# Patient Record
Sex: Male | Born: 1950 | Race: White | Hispanic: No | Marital: Married | Smoking: Former smoker
Health system: Southern US, Community
[De-identification: ages and names within clinical notes are randomized; demographics above are authoritative.]

## PROBLEM LIST (undated history)

## (undated) DIAGNOSIS — K635 Polyp of colon: Secondary | ICD-10-CM

## (undated) DIAGNOSIS — R519 Headache, unspecified: Secondary | ICD-10-CM

## (undated) DIAGNOSIS — M5430 Sciatica, unspecified side: Secondary | ICD-10-CM

## (undated) DIAGNOSIS — G4730 Sleep apnea, unspecified: Secondary | ICD-10-CM

## (undated) DIAGNOSIS — K760 Fatty (change of) liver, not elsewhere classified: Secondary | ICD-10-CM

## (undated) DIAGNOSIS — T7840XA Allergy, unspecified, initial encounter: Secondary | ICD-10-CM

## (undated) DIAGNOSIS — K5730 Diverticulosis of large intestine without perforation or abscess without bleeding: Secondary | ICD-10-CM

## (undated) DIAGNOSIS — J45909 Unspecified asthma, uncomplicated: Secondary | ICD-10-CM

## (undated) DIAGNOSIS — R51 Headache: Secondary | ICD-10-CM

## (undated) DIAGNOSIS — M1990 Unspecified osteoarthritis, unspecified site: Secondary | ICD-10-CM

## (undated) HISTORY — DX: Unspecified asthma, uncomplicated: J45.909

## (undated) HISTORY — PX: TONSILLECTOMY: SHX5217

## (undated) HISTORY — DX: Headache, unspecified: R51.9

## (undated) HISTORY — DX: Polyp of colon: K63.5

## (undated) HISTORY — DX: Headache: R51

## (undated) HISTORY — DX: Diverticulosis of large intestine without perforation or abscess without bleeding: K57.30

## (undated) HISTORY — DX: Unspecified osteoarthritis, unspecified site: M19.90

## (undated) HISTORY — DX: Allergy, unspecified, initial encounter: T78.40XA

## (undated) HISTORY — DX: Sciatica, unspecified side: M54.30

---

## 2008-06-11 ENCOUNTER — Ambulatory Visit: Payer: Self-pay | Admitting: General Surgery

## 2008-06-18 ENCOUNTER — Ambulatory Visit: Payer: Self-pay | Admitting: General Surgery

## 2008-06-19 ENCOUNTER — Emergency Department: Payer: Self-pay | Admitting: Emergency Medicine

## 2008-06-20 ENCOUNTER — Inpatient Hospital Stay: Payer: Self-pay | Admitting: Surgery

## 2010-01-21 ENCOUNTER — Ambulatory Visit: Payer: Self-pay | Admitting: Gastroenterology

## 2010-01-25 LAB — PATHOLOGY REPORT

## 2010-03-04 IMAGING — CR DG CHEST 2V
1 series · 2 of 2 positions shown · non-contrast
Comparison: none

REASON FOR EXAM: smoker
COMMENTS:

[Series 1: view not recorded · 0.17mm/px · 2 of 2 slices shown]
[im 1/2]
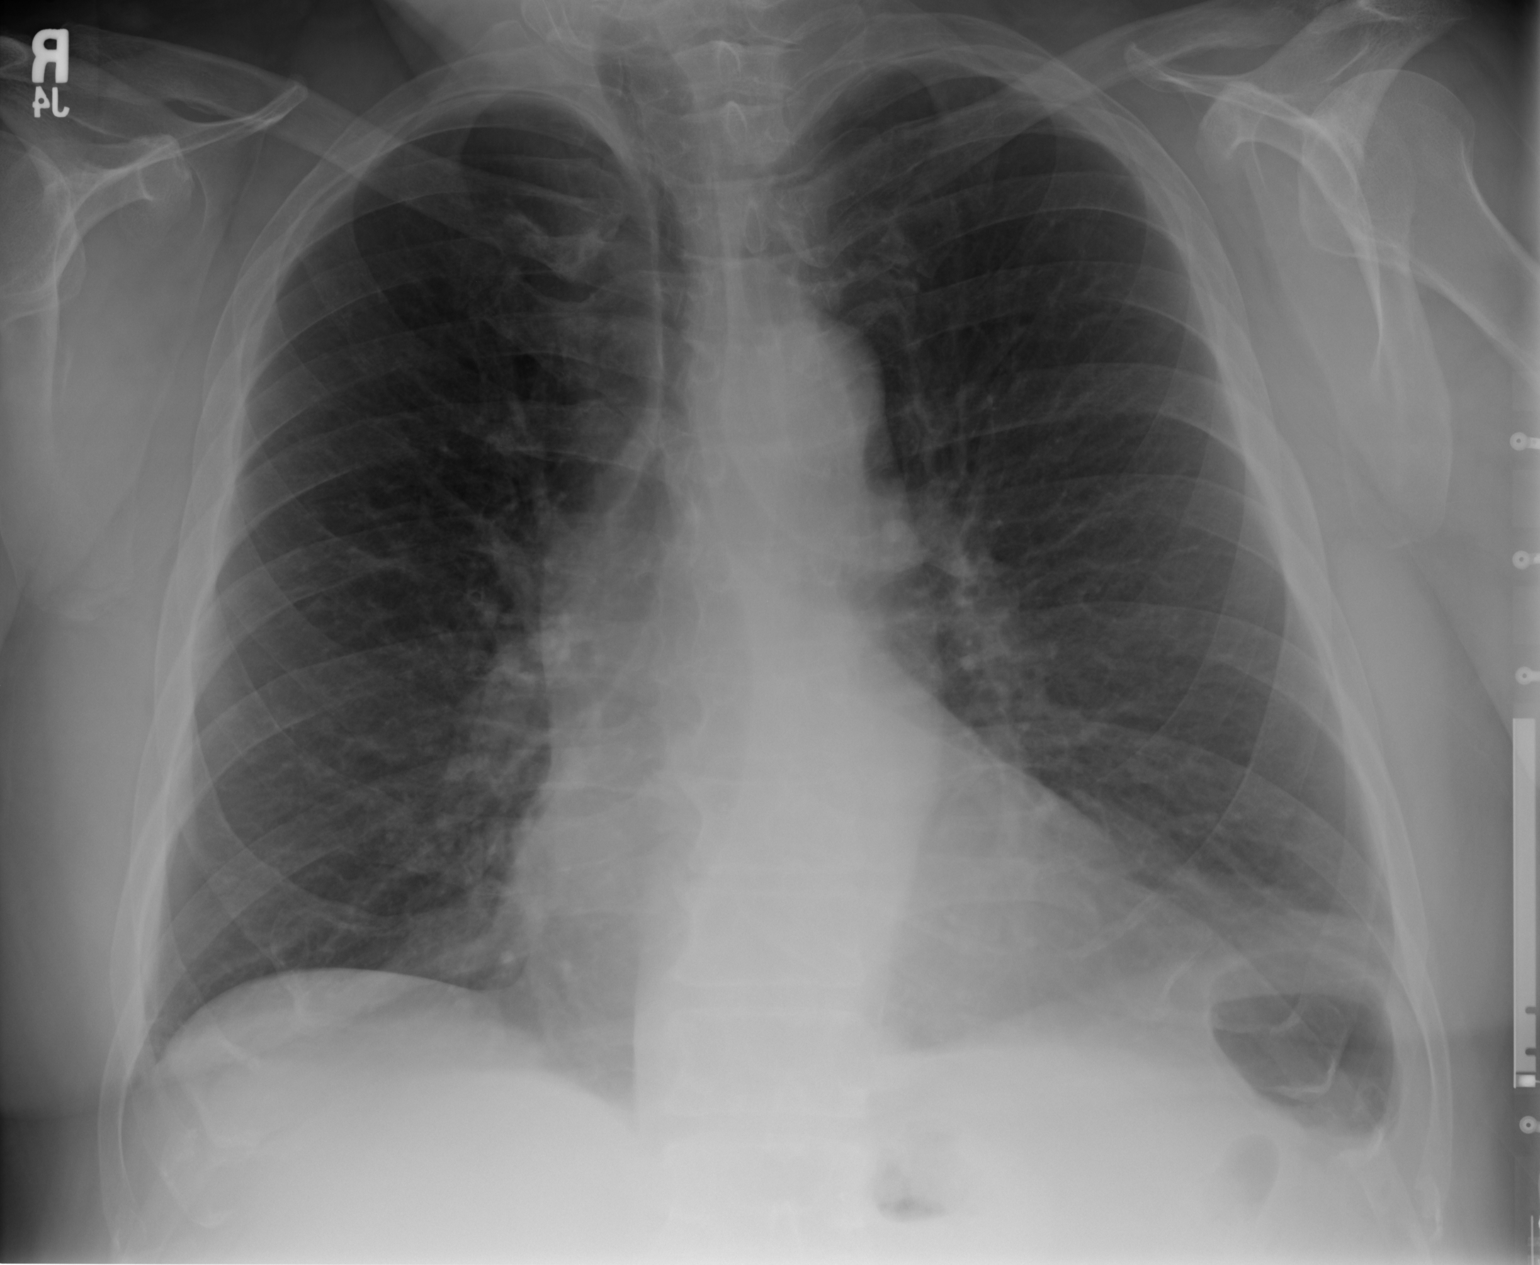
[im 2/2]
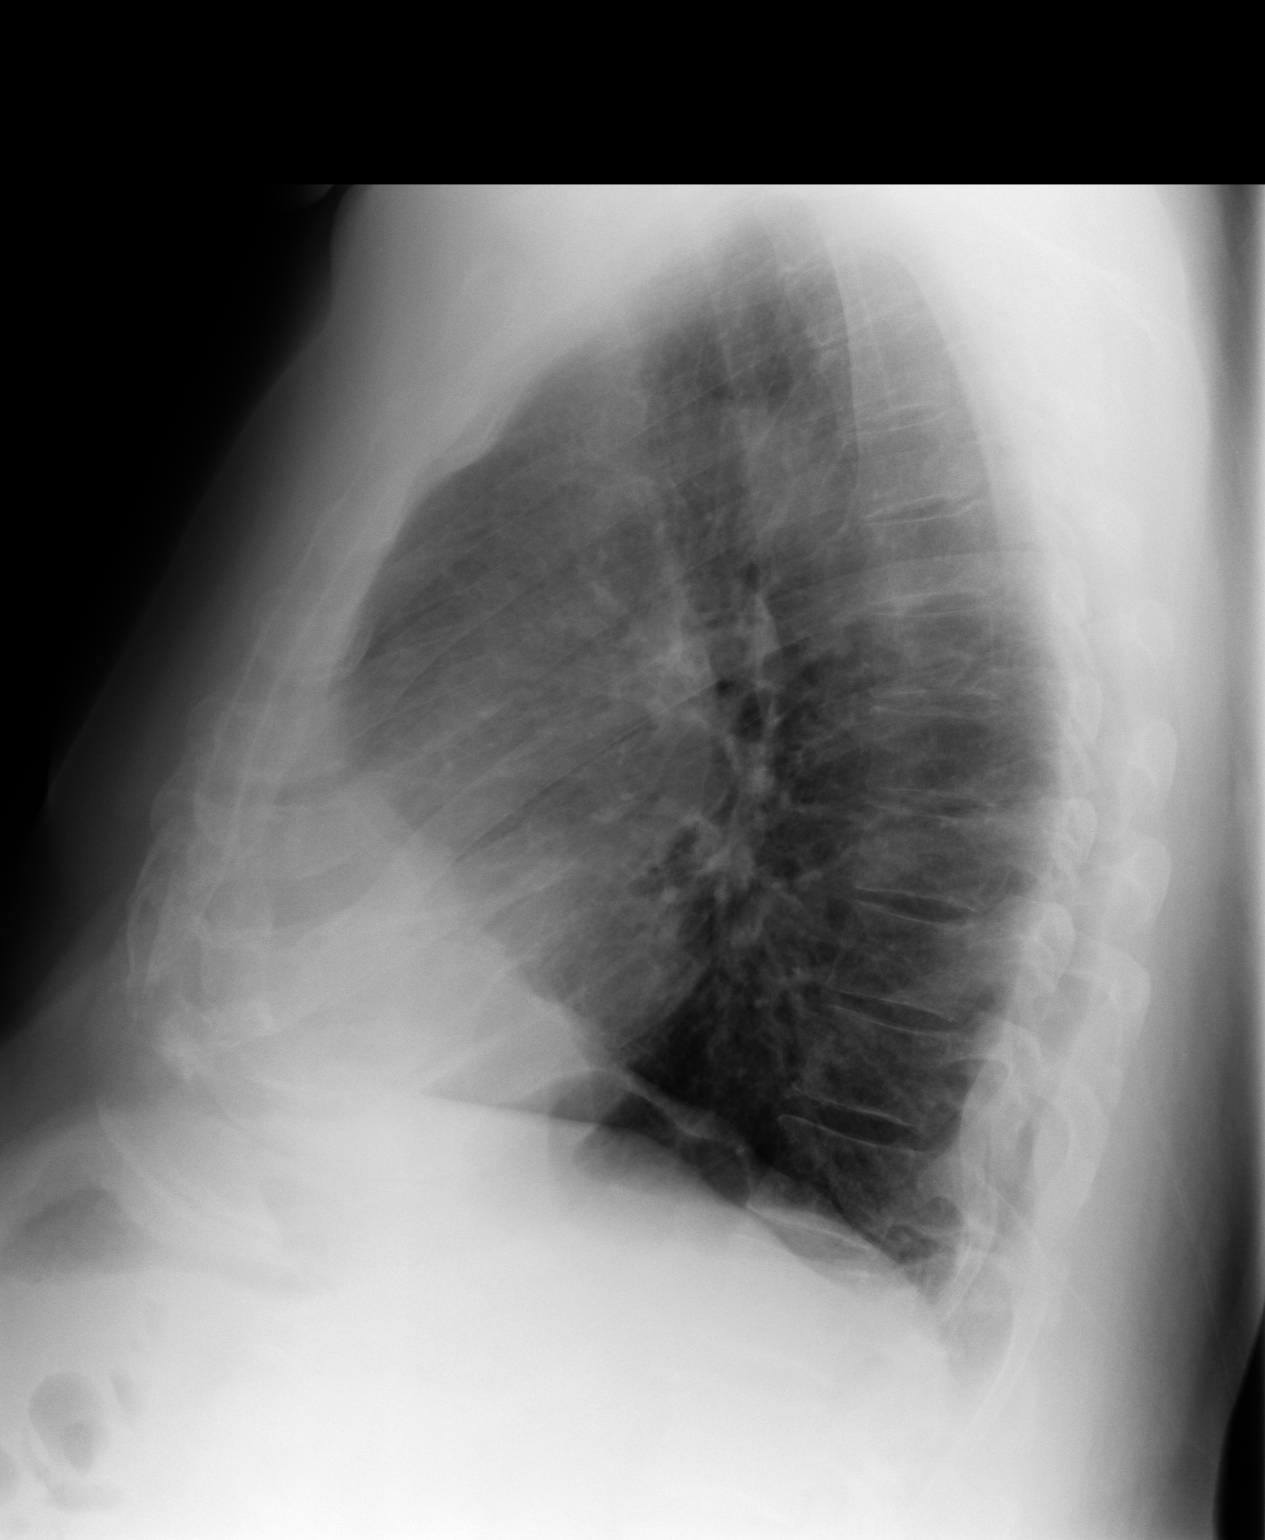

[2 of 2 positions shown; findings below may reference images not displayed]

PROCEDURE:     DXR - DXR CHEST PA (OR AP) AND LATERAL  - June 11, 2008 [DATE]

RESULT:     The lungs are mildly hyperinflated. There are coarse lung
markings at the left lung base. I see no discrete focal infiltrate however.
There is no pleural effusion. The cardiac silhouette is top normal in size.
The pulmonary vascularity is not engorged.
IMPRESSION: There are findings consistent with an element of COPD. I do
not see marked hyperinflation. There is no evidence of pneumonia nor of a
pulmonary parenchymal mass.

## 2014-08-24 ENCOUNTER — Telehealth: Payer: Self-pay | Admitting: Family Medicine

## 2014-08-24 DIAGNOSIS — E785 Hyperlipidemia, unspecified: Secondary | ICD-10-CM | POA: Insufficient documentation

## 2014-08-24 DIAGNOSIS — E875 Hyperkalemia: Secondary | ICD-10-CM

## 2014-08-24 NOTE — Telephone Encounter (Signed)
The patient's lab results were reviewed on [employer] please abstract patient chart and relay the following information to the patient:  This patient's most recent lab results have been reviewed and are within normal ranges including:  Blood counts Liver function Kidney function Thyroid function  Abnormal: Electrolytes normal except Potassium a little high at 5.3 (normal is 3.5-5.2), cut back on potassium rich foods Glucose high at 115mg /dL Cholesterol very high several values   Please have patient f/u with me to discuss treatment options and to recheck his potassium preferably no later than 1 month from now.   A copy of the results can be provided to the patient if they would like.

## 2014-08-24 NOTE — Telephone Encounter (Signed)
Per the request of Dr. Bobetta Lime, I contacted this patient to review the results from this patient's recent blood work and after he verified his date of birth, results were reviewed. Patient was informed that some of his labs were abnormal which requires him to come in for a follow up visit in 1 month. Meanwhile patient was encouraged to reduce his intake of potassium rich food, continue with a mindful diet and to incorporate regular exercise. Patient agreed and stated he will call tomorrow to schedule his appointment.

## 2014-09-16 ENCOUNTER — Other Ambulatory Visit: Payer: Self-pay | Admitting: Family Medicine

## 2014-09-16 ENCOUNTER — Encounter: Payer: Self-pay | Admitting: Family Medicine

## 2014-09-16 ENCOUNTER — Ambulatory Visit (INDEPENDENT_AMBULATORY_CARE_PROVIDER_SITE_OTHER): Payer: 59 | Admitting: Family Medicine

## 2014-09-16 VITALS — BP 118/72 | HR 121 | Temp 98.2°F | Resp 18 | Ht 71.0 in | Wt 276.6 lb

## 2014-09-16 DIAGNOSIS — R0982 Postnasal drip: Secondary | ICD-10-CM

## 2014-09-16 DIAGNOSIS — J309 Allergic rhinitis, unspecified: Secondary | ICD-10-CM | POA: Insufficient documentation

## 2014-09-16 DIAGNOSIS — L989 Disorder of the skin and subcutaneous tissue, unspecified: Secondary | ICD-10-CM | POA: Insufficient documentation

## 2014-09-16 DIAGNOSIS — E785 Hyperlipidemia, unspecified: Secondary | ICD-10-CM | POA: Diagnosis not present

## 2014-09-16 DIAGNOSIS — M1990 Unspecified osteoarthritis, unspecified site: Secondary | ICD-10-CM | POA: Insufficient documentation

## 2014-09-16 DIAGNOSIS — IMO0001 Reserved for inherently not codable concepts without codable children: Secondary | ICD-10-CM

## 2014-09-16 DIAGNOSIS — M5431 Sciatica, right side: Secondary | ICD-10-CM | POA: Diagnosis not present

## 2014-09-16 DIAGNOSIS — Z0000 Encounter for general adult medical examination without abnormal findings: Secondary | ICD-10-CM | POA: Diagnosis not present

## 2014-09-16 DIAGNOSIS — K5730 Diverticulosis of large intestine without perforation or abscess without bleeding: Secondary | ICD-10-CM | POA: Insufficient documentation

## 2014-09-16 DIAGNOSIS — Z8709 Personal history of other diseases of the respiratory system: Secondary | ICD-10-CM | POA: Insufficient documentation

## 2014-09-16 DIAGNOSIS — K635 Polyp of colon: Secondary | ICD-10-CM | POA: Insufficient documentation

## 2014-09-16 DIAGNOSIS — K5909 Other constipation: Secondary | ICD-10-CM | POA: Insufficient documentation

## 2014-09-16 NOTE — Patient Instructions (Signed)
Fat and Cholesterol Control Diet Fat and cholesterol levels in your blood and organs are influenced by your diet. High levels of fat and cholesterol may lead to diseases of the heart, small and large blood vessels, gallbladder, liver, and pancreas. CONTROLLING FAT AND CHOLESTEROL WITH DIET Although exercise and lifestyle factors are important, your diet is key. That is because certain foods are known to raise cholesterol and others to lower it. The goal is to balance foods for their effect on cholesterol and more importantly, to replace saturated and trans fat with other types of fat, such as monounsaturated fat, polyunsaturated fat, and omega-3 fatty acids. On average, a person should consume no more than 15 to 17 g of saturated fat daily. Saturated and trans fats are considered "bad" fats, and they will raise LDL cholesterol. Saturated fats are primarily found in animal products such as meats, butter, and cream. However, that does not mean you need to give up all your favorite foods. Today, there are good tasting, low-fat, low-cholesterol substitutes for most of the things you like to eat. Choose low-fat or nonfat alternatives. Choose round or loin cuts of red meat. These types of cuts are lowest in fat and cholesterol. Chicken (without the skin), fish, veal, and ground turkey breast are great choices. Eliminate fatty meats, such as hot dogs and salami. Even shellfish have little or no saturated fat. Have a 3 oz (85 g) portion when you eat lean meat, poultry, or fish. Trans fats are also called "partially hydrogenated oils." They are oils that have been scientifically manipulated so that they are solid at room temperature resulting in a longer shelf life and improved taste and texture of foods in which they are added. Trans fats are found in stick margarine, some tub margarines, cookies, crackers, and baked goods.  When baking and cooking, oils are a great substitute for butter. The monounsaturated oils are  especially beneficial since it is believed they lower LDL and raise HDL. The oils you should avoid entirely are saturated tropical oils, such as coconut and palm.  Remember to eat a lot from food groups that are naturally free of saturated and trans fat, including fish, fruit, vegetables, beans, grains (barley, rice, couscous, bulgur wheat), and pasta (without cream sauces).  IDENTIFYING FOODS THAT LOWER FAT AND CHOLESTEROL  Soluble fiber may lower your cholesterol. This type of fiber is found in fruits such as apples, vegetables such as broccoli, potatoes, and carrots, legumes such as beans, peas, and lentils, and grains such as barley. Foods fortified with plant sterols (phytosterol) may also lower cholesterol. You should eat at least 2 g per day of these foods for a cholesterol lowering effect.  Read package labels to identify low-saturated fats, trans fat free, and low-fat foods at the supermarket. Select cheeses that have only 2 to 3 g saturated fat per ounce. Use a heart-healthy tub margarine that is free of trans fats or partially hydrogenated oil. When buying baked goods (cookies, crackers), avoid partially hydrogenated oils. Breads and muffins should be made from whole grains (whole-wheat or whole oat flour, instead of "flour" or "enriched flour"). Buy non-creamy canned soups with reduced salt and no added fats.  FOOD PREPARATION TECHNIQUES  Never deep-fry. If you must fry, either stir-fry, which uses very little fat, or use non-stick cooking sprays. When possible, broil, bake, or roast meats, and steam vegetables. Instead of putting butter or margarine on vegetables, use lemon and herbs, applesauce, and cinnamon (for squash and sweet potatoes). Use nonfat   yogurt, salsa, and low-fat dressings for salads.  LOW-SATURATED FAT / LOW-FAT FOOD SUBSTITUTES Meats / Saturated Fat (g)  Avoid: Steak, marbled (3 oz/85 g) / 11 g  Choose: Steak, lean (3 oz/85 g) / 4 g  Avoid: Hamburger (3 oz/85 g) / 7  g  Choose: Hamburger, lean (3 oz/85 g) / 5 g  Avoid: Ham (3 oz/85 g) / 6 g  Choose: Ham, lean cut (3 oz/85 g) / 2.4 g  Avoid: Chicken, with skin, dark meat (3 oz/85 g) / 4 g  Choose: Chicken, skin removed, dark meat (3 oz/85 g) / 2 g  Avoid: Chicken, with skin, light meat (3 oz/85 g) / 2.5 g  Choose: Chicken, skin removed, light meat (3 oz/85 g) / 1 g Dairy / Saturated Fat (g)  Avoid: Whole milk (1 cup) / 5 g  Choose: Low-fat milk, 2% (1 cup) / 3 g  Choose: Low-fat milk, 1% (1 cup) / 1.5 g  Choose: Skim milk (1 cup) / 0.3 g  Avoid: Hard cheese (1 oz/28 g) / 6 g  Choose: Skim milk cheese (1 oz/28 g) / 2 to 3 g  Avoid: Cottage cheese, 4% fat (1 cup) / 6.5 g  Choose: Low-fat cottage cheese, 1% fat (1 cup) / 1.5 g  Avoid: Ice cream (1 cup) / 9 g  Choose: Sherbet (1 cup) / 2.5 g  Choose: Nonfat frozen yogurt (1 cup) / 0.3 g  Choose: Frozen fruit bar / trace  Avoid: Whipped cream (1 tbs) / 3.5 g  Choose: Nondairy whipped topping (1 tbs) / 1 g Condiments / Saturated Fat (g)  Avoid: Mayonnaise (1 tbs) / 2 g  Choose: Low-fat mayonnaise (1 tbs) / 1 g  Avoid: Butter (1 tbs) / 7 g  Choose: Extra light margarine (1 tbs) / 1 g  Avoid: Coconut oil (1 tbs) / 11.8 g  Choose: Olive oil (1 tbs) / 1.8 g  Choose: Corn oil (1 tbs) / 1.7 g  Choose: Safflower oil (1 tbs) / 1.2 g  Choose: Sunflower oil (1 tbs) / 1.4 g  Choose: Soybean oil (1 tbs) / 2.4 g  Choose: Canola oil (1 tbs) / 1 g Document Released: 03/06/2005 Document Revised: 07/01/2012 Document Reviewed: 06/04/2013 [employer] Patient Information 2015 [employer]. This information is not intended to replace advice given to you by your health care provider. Make sure you discuss any questions you have with your health care provider.  

## 2014-09-16 NOTE — Progress Notes (Deleted)
Name: Michael Bartlett   MRN: [id number]    DOB: Aug 28, 1950   Date:09/16/2014       Progress Note  Subjective  Chief Complaint  Chief Complaint  Patient presents with  . Annual Exam  . Referral    patient needs a referral to a dermatologist for possible skin cancer    HPI  Patient is here today for a Male Medicare Wellness Visit:  The patient has been in otherwise good general health and voices no acute concerns today.   No problem-specific assessment & plan notes found for this encounter.   No past medical history on file.  No past surgical history on file.  No family history on file.  History   Social History  . Marital Status: Married    Spouse Name: N/A  . Number of Children: N/A  . Years of Education: N/A   Occupational History  . Not on file.   Social History Main Topics  . Smoking status: Not on file  . Smokeless tobacco: Not on file  . Alcohol Use: Not on file  . Drug Use: Not on file  . Sexual Activity: Not on file   Other Topics Concern  . Not on file   Social History Narrative  . No narrative on file     Current outpatient prescriptions:  .  Cholecalciferol (D3-1000) 1000 UNITS capsule, Take 1 capsule by mouth daily., Disp: , Rfl:  .  Coenzyme Q10 (COQ10) 100 MG CAPS, Take 1 capsule by mouth daily., Disp: , Rfl:  .  fluticasone (FLONASE) 50 MCG/ACT nasal spray, Place 2 sprays into both nostrils daily., Disp: , Rfl:  .  Multiple Vitamin (MULTI-VITAMINS) TABS, Take 1 tablet by mouth daily., Disp: , Rfl:  .  Omega-3 Fatty Acids (FISH OIL) 1200 MG CPDR, Take 1 capsule by mouth daily., Disp: , Rfl:  .  omeprazole (PRILOSEC) 40 MG capsule, Take 1 capsule by mouth daily., Disp: , Rfl: 5 .  saw palmetto (RA SAW PALMETTO) 80 MG capsule, Take 1 capsule by mouth daily., Disp: , Rfl:  .  SENNA S 8.6-50 MG per tablet, Take 1 tablet by mouth 2 (two) times daily as needed., Disp: , Rfl: 0 .  vitamin E 400 UNIT capsule, Take 1 capsule by mouth daily., Disp: ,  Rfl:   Allergies  Allergen Reactions  . Aspirin Anaphylaxis and Hives    Fall Risk: No flowsheet data found.  No flowsheet data found.   ROS  CONSTITUTIONAL: No significant weight changes, fever, chills, weakness or fatigue.  HEENT:  - Eyes: No visual changes.  - Ears: No auditory changes. No pain.  - Nose: No sneezing, congestion, runny nose. - Throat: No sore throat. No changes in swallowing. SKIN: No rash or itching.  CARDIOVASCULAR: No chest pain, chest pressure or chest discomfort. No palpitations or edema.  RESPIRATORY: No shortness of breath, cough or sputum.  GASTROINTESTINAL: No anorexia, nausea, vomiting. No changes in bowel habits. No abdominal pain or blood.  GENITOURINARY: No dysuria. No frequency. No discharge.  NEUROLOGICAL: No headache, dizziness, syncope, paralysis, ataxia, numbness or tingling in the extremities. No memory changes. No change in bowel or bladder control.  MUSCULOSKELETAL: No joint pain. No muscle pain. HEMATOLOGIC: No anemia, bleeding or bruising.  LYMPHATICS: No enlarged lymph nodes.  PSYCHIATRIC: No change in mood. No change in sleep pattern.  ENDOCRINOLOGIC: No reports of sweating, cold or heat intolerance. No polyuria or polydipsia.   Objective  Filed Vitals:   09/16/14 1417  BP: 118/72  Pulse: 121  Temp: 98.2 F (36.8 C)  TempSrc: Oral  Resp: 18  Height: 5\' 11"  (1.803 m)  Weight: 276 lb 9.6 oz (125.465 kg)  SpO2: 95%   Body mass index is 38.6 kg/(m^2).  Physical Exam  Constitutional: Patient appears well-developed and well-nourished. In no distress.  HEENT:  - Head: Normocephalic and atraumatic.  - Ears: Bilateral TMs gray, no erythema or effusion - Nose: Nasal mucosa moist - Mouth/Throat: Oropharynx is clear and moist. No tonsillar hypertrophy or erythema. No post nasal drainage.  - Eyes: Conjunctivae clear, EOM movements normal. PERRLA. No scleral icterus.  Neck: Normal range of motion. Neck supple. No JVD present. No  thyromegaly present.  Cardiovascular: Normal rate, regular rhythm and normal heart sounds.  No murmur heard.  Pulmonary/Chest: Effort normal and breath sounds normal. No respiratory distress. Musculoskeletal: Normal range of motion bilateral UE and LE, no joint effusions. Peripheral vascular: Bilateral LE no edema. Neurological: CN II-XII grossly intact with no focal deficits. Alert and oriented to person, place, and time. Coordination, balance, strength, speech and gait are normal.  Skin: Skin is warm and dry. No rash noted. No erythema.  Psychiatric: Patient has a normal mood and affect. Behavior is normal in office today. Judgment and thought content normal in office today.   Assessment & Plan  Functional ability/safety issues: No Issues Hearing issues: Addressed  Activities of daily living: Discussed Home safety issues: No Issues  End Of Life Planning: Offered verbal information regarding advanced directives, healthcare power of attorney.  Preventative care, Health maintenance, Preventative health measures discussed.  Preventative screenings discussed today: lab work, colonoscopy, PSA.  Men age 57 to 74 years if ever smoked recommended to get a one time AAA ultrasound screening exam.  Low Dose CT Chest recommended if Age 37-80 years, 30 pack-year currently smoking OR have quit w/in 15years.   Lifestyle risk factor issued reviewed: Diet, exercise, weight management, advised patient smoking is not healthy, nutrition/diet.  Preventative health measures discussed (5-10 year plan).  Reviewed and recommended vaccinations: - Pneumovax  - Prevnar  - Annual Influenza - Zostavax - Tdap   Depression screening: Done Fall risk screening: Done Discuss ADLs/IADLs: Done  Current medical providers: See HPI  Other health risk factors identified this visit: No other issues Cognitive impairment issues: None identified  All above discussed with patient. Appropriate education, counseling  and referral will be made based upon the above.

## 2014-09-16 NOTE — Progress Notes (Signed)
Name: Michael Bartlett   MRN: [id number]    DOB: 05/14/1950   Date:09/16/2014       Progress Note  Subjective  Chief Complaint  Chief Complaint  Patient presents with  . Annual Exam  . Referral    patient needs a referral to a dermatologist for possible skin cancer  . Medication Refill    omeprazole    HPI  Patient is here today for a Complete Male Physical Exam:  The patient has some ongoing concerns regarding skin cancer potential. Overall feels his health is stable. Diet is well balanced but consists of large portions. In general does not exercise regularly. Sees dentist regularly and addresses vision concerns with ophthalmologist if applicable. In regards to sexual activity the patient is currently sexually active. Currently is not concerned about exposure to any STDs. Constipation improved on Senakot medication prescribed previously. He has also implemented lifestyle changes.   Hyperlipidemia: Patient presents with hyperlipidemia.  He was tested because of his obesity.  His last labs showed Total cholesterol of 260 mg/dL, HDL 50 mg/dL, LDL 174 mg/dL,  Triglycerides 181 mg/dL.  There is a family history of hyperlipidemia. There is not a family history of early ischemia heart disease.    Past Medical History  Diagnosis Date  . Allergy   . Sciatica   . Arthritis   . Sinus headache   . Asthma   . Colon polyps   . Diverticulosis of large intestine without hemorrhage     Past Surgical History  Procedure Laterality Date  . Tonsillectomy      Family History  Problem Relation Age of Onset  . Bleeding Disorder Mother   . Stroke Father   . Heart disease Father   . Alcohol abuse Father   . Depression Maternal Grandmother   . Mental illness Maternal Grandmother     History   Social History  . Marital Status: Married    Spouse Name: N/A  . Number of Children: N/A  . Years of Education: N/A   Occupational History  . Not on file.   Social History Main Topics  .  Smoking status: Former Research scientist (life sciences)  . Smokeless tobacco: Not on file     Comment: will be 50yrs w/o smoking October 2016  . Alcohol Use: No  . Drug Use: No  . Sexual Activity:    Partners: Female    Museum/gallery curator: None   Other Topics Concern  . Not on file   Social History Narrative  . No narrative on file     Current outpatient prescriptions:  .  Cholecalciferol (D3-1000) 1000 UNITS capsule, Take 1 capsule by mouth daily., Disp: , Rfl:  .  Coenzyme Q10 (COQ10) 100 MG CAPS, Take 1 capsule by mouth daily., Disp: , Rfl:  .  fluticasone (FLONASE) 50 MCG/ACT nasal spray, Place 2 sprays into both nostrils daily., Disp: , Rfl:  .  Multiple Vitamin (MULTI-VITAMINS) TABS, Take 1 tablet by mouth daily., Disp: , Rfl:  .  Omega-3 Fatty Acids (FISH OIL) 1200 MG CPDR, Take 1 capsule by mouth daily., Disp: , Rfl:  .  omeprazole (PRILOSEC) 40 MG capsule, Take 1 capsule by mouth daily., Disp: , Rfl: 5 .  saw palmetto (RA SAW PALMETTO) 80 MG capsule, Take 1 capsule by mouth daily., Disp: , Rfl:  .  SENNA S 8.6-50 MG per tablet, Take 1 tablet by mouth 2 (two) times daily as needed., Disp: , Rfl: 0 .  vitamin E 400 UNIT capsule, Take  1 capsule by mouth daily., Disp: , Rfl:   Allergies  Allergen Reactions  . Aspirin Anaphylaxis and Hives   Depression screen [facility] 2/9 09/16/2014  Decreased Interest 0  Down, Depressed, Hopeless 0  PHQ - 2 Score 0    ROS  CONSTITUTIONAL: No significant weight changes, fever, chills, weakness or fatigue.  HEENT:  - Eyes: No visual changes.  - Ears: No auditory changes. No pain.  - Nose: No sneezing, congestion, runny nose. - Throat: No sore throat. No changes in swallowing. SKIN: Suspicious changes especially sun exposed areas  CARDIOVASCULAR: No chest pain, chest pressure or chest discomfort. No palpitations or edema.  RESPIRATORY: No shortness of breath, cough or sputum.  GASTROINTESTINAL: No anorexia, nausea, vomiting. No changes in bowel habits,  constipation improved. No abdominal pain or blood.  GENITOURINARY: No dysuria. No frequency. No discharge.  NEUROLOGICAL: No headache, dizziness, syncope, paralysis, ataxia, numbness or tingling in the extremities. No memory changes. No change in bowel or bladder control.  MUSCULOSKELETAL: No joint pain. No muscle pain. HEMATOLOGIC: No anemia, bleeding or bruising.  LYMPHATICS: No enlarged lymph nodes.  PSYCHIATRIC: No change in mood. No change in sleep pattern.  ENDOCRINOLOGIC: No reports of sweating, cold or heat intolerance. No polyuria or polydipsia.   Objective  Filed Vitals:   09/16/14 1417  BP: 118/72  Pulse: 121  Temp: 98.2 F (36.8 C)  TempSrc: Oral  Resp: 18  Height: 5\' 11"  (1.803 m)  Weight: 276 lb 9.6 oz (125.465 kg)  SpO2: 95%   Body mass index is 38.6 kg/(m^2).   Depression screen [facility] 2/9 09/16/2014  Decreased Interest 0  Down, Depressed, Hopeless 0  PHQ - 2 Score 0    Lab work done 08/20/14 outside sources: Cholesterol total 260mg /dL LDL 174mg /dL HDL 50mg /dL Triglycerides 181mg /dL VLDL 36mg /dL  Physical Exam  Constitutional: Patient is obese and well-nourished. In no distress.  HEENT:  - Head: Normocephalic and atraumatic.  - Ears: Bilateral TMs gray, no erythema or effusion - Nose: Nasal mucosa moist - Mouth/Throat: Oropharynx is clear and moist. No tonsillar hypertrophy or erythema. No post nasal drainage.  - Eyes: Conjunctivae clear, EOM movements normal. PERRLA. No scleral icterus.  Neck: Normal range of motion. Neck supple. No JVD present. No thyromegaly present.  Cardiovascular: Normal rate, regular rhythm and normal heart sounds.  No murmur heard.  Pulmonary/Chest: Effort normal and breath sounds normal. No respiratory distress. Abdominal: Obese and Soft. Bowel sounds are normal, no distension. There is no tenderness. no masses. Does have a midline ventral hernia. BREAST: Bilateral breast exam normal with no masses, skin changes or nipple  discharge MALE GENITALIA: Bilateral testes descended with no masses, no penile lesions, no penile discharge. PROSTATE: deferred RECTAL: deferred Musculoskeletal: Normal range of motion bilateral UE and LE, no joint effusions. Right gluteal midpoint reproducible pain on straight leg test. Peripheral vascular: Bilateral LE no edema. Neurological: CN II-XII grossly intact with no focal deficits. Alert and oriented to person, place, and time. Coordination, balance, strength, speech and gait are normal.  Skin: Skin is warm and dry. Generalized actinic keratosis scalp, back, shoulders, face. Scaly raised lesion above left eyebrow border. Scattered skin tags.  Psychiatric: Patient has a normal mood and affect. Behavior is normal in office today. Judgment and thought content normal in office today.    Assessment & Plan  1. Annual physical exam   2. Obesity, Class II, BMI 35-39.9, with comorbidity The patient has been counseled on their higher than normal BMI.  They have verbally expressed understanding their increased risk for other diseases.  In efforts to meet a better target BMI goal the patient has been counseled on lifestyle, diet and exercise modification tactics. Start with moderate intensity aerobic exercise (walking, jogging, elliptical, swimming, group or individual sports, hiking) at least 71mins a day at least 4 days a week and increase intensity, duration, frequency as tolerated. Diet should include well balance fresh fruits and vegetables avoiding processed foods, carbohydrates and sugars. Drink at least 8oz 10 glasses a day avoiding sodas, sugary fruit drinks, sweetened tea. Check weight on a reliable scale daily and monitor weight loss progress daily. Consider investing in mobile phone apps that will help keep track of weight loss goals.   3. Hyperlipidemia LDL goal <100 Reviewed blood work in detail and recommended statin therapy. He has had intolerance to pravastatin in the past due  to myalgia/arthralgia. He will work on lifestyle changes and we will repeat FLP in 6 months.  4. Skin lesion of face Will refer to dermatology for further work up.  - Ambulatory referral to Dermatology

## 2014-09-30 ENCOUNTER — Encounter: Payer: Self-pay | Admitting: Family Medicine

## 2014-11-11 ENCOUNTER — Other Ambulatory Visit (INDEPENDENT_AMBULATORY_CARE_PROVIDER_SITE_OTHER): Payer: 59 | Admitting: Family Medicine

## 2014-11-11 DIAGNOSIS — Z23 Encounter for immunization: Secondary | ICD-10-CM

## 2014-11-25 ENCOUNTER — Other Ambulatory Visit: Payer: Self-pay | Admitting: Unknown Physician Specialty

## 2014-12-30 ENCOUNTER — Other Ambulatory Visit: Payer: Self-pay | Admitting: Unknown Physician Specialty

## 2015-01-18 ENCOUNTER — Encounter: Payer: Self-pay | Admitting: Family Medicine

## 2015-01-18 ENCOUNTER — Ambulatory Visit (INDEPENDENT_AMBULATORY_CARE_PROVIDER_SITE_OTHER): Payer: Commercial Managed Care - HMO | Admitting: Family Medicine

## 2015-01-18 VITALS — BP 84/50 | HR 91 | Temp 97.8°F | Resp 18 | Ht 71.0 in | Wt 246.9 lb

## 2015-01-18 DIAGNOSIS — J309 Allergic rhinitis, unspecified: Secondary | ICD-10-CM

## 2015-01-18 DIAGNOSIS — K219 Gastro-esophageal reflux disease without esophagitis: Secondary | ICD-10-CM | POA: Insufficient documentation

## 2015-01-18 DIAGNOSIS — J45909 Unspecified asthma, uncomplicated: Secondary | ICD-10-CM

## 2015-01-18 DIAGNOSIS — R0982 Postnasal drip: Secondary | ICD-10-CM

## 2015-01-18 DIAGNOSIS — I959 Hypotension, unspecified: Secondary | ICD-10-CM | POA: Insufficient documentation

## 2015-01-18 MED ORDER — FLUTICASONE PROPIONATE 50 MCG/ACT NA SUSP: 2.0000 | Freq: Every day | NASAL | Status: AC

## 2015-01-18 MED ORDER — OMEPRAZOLE 40 MG PO CPDR: 40.0000 mg | DELAYED_RELEASE_CAPSULE | Freq: Every day | ORAL | Status: AC

## 2015-01-18 NOTE — Progress Notes (Signed)
Name: Michael Bartlett   MRN: [id number]    DOB: 02-Sep-1950   Date:01/18/2015       Progress Note  Subjective  Chief Complaint  Chief Complaint  Patient presents with  . Medication Refill  . Gastroesophageal Reflux    Well Controlled with medication    HPI  Michael Bartlett is a 64 year old male here to request medical refills. Has no complaints. Blood pressure notably low however he remains asymptomatic. Has C-scope scheduled 03/22/14. Continues to lose weight with diet and lifestyle modification. Planning on traveling with his wife in their RV and will not be back for 1 year.  Past Medical History  Diagnosis Date  . Allergy   . Sciatica   . Arthritis   . Sinus headache   . Asthma   . Colon polyps   . Diverticulosis of large intestine without hemorrhage     Patient Active Problem List   Diagnosis Date Noted  . GERD without esophagitis 01/18/2015  . Arthritis 09/16/2014  . Chronic constipation 09/16/2014  . Colon polyp 09/16/2014  . Colon, diverticulosis 09/16/2014  . History of asthma 09/16/2014  . Allergic rhinitis with postnasal drip 09/16/2014  . Obesity, Class II, BMI 35-39.9, with comorbidity ([place]) 09/16/2014  . Skin lesion of face 09/16/2014  . Annual physical exam 09/16/2014  . Right sciatic nerve pain 09/16/2014  . Hyperlipidemia LDL goal <100 08/24/2014  . Hyperkalemia 08/24/2014    Social History  Substance Use Topics  . Smoking status: Former Research scientist (life sciences)  . Smokeless tobacco: Not on file     Comment: will be 79yrs w/o smoking October 2016  . Alcohol Use: No     Current outpatient prescriptions:  .  Cholecalciferol (D3-1000) 1000 UNITS capsule, Take 1 capsule by mouth daily., Disp: , Rfl:  .  Coenzyme Q10 (COQ10) 100 MG CAPS, Take 1 capsule by mouth daily., Disp: , Rfl:  .  fluticasone (FLONASE) 50 MCG/ACT nasal spray, Place 2 sprays into both nostrils daily., Disp: 16 g, Rfl: 5 .  Multiple Vitamin (MULTI-VITAMINS) TABS, Take 1 tablet by mouth daily., Disp:  , Rfl:  .  Omega-3 Fatty Acids (FISH OIL) 1200 MG CPDR, Take 1 capsule by mouth daily., Disp: , Rfl:  .  omeprazole (PRILOSEC) 40 MG capsule, Take 1 capsule (40 mg total) by mouth daily., Disp: 90 capsule, Rfl: 3 .  polyethylene glycol (MIRALAX / GLYCOLAX) packet, Take 17 g by mouth daily as needed., Disp: , Rfl:  .  polyethylene glycol-electrolytes (NULYTELY/GOLYTELY) 420 G solution, TK 4000 MLS PO ONCE UTD FOR COLONOSCOPY PREP, Disp: , Rfl: 0 .  saw palmetto (RA SAW PALMETTO) 80 MG capsule, Take 1 capsule by mouth daily., Disp: , Rfl:  .  vitamin E 400 UNIT capsule, Take 1 capsule by mouth daily., Disp: , Rfl:   Past Surgical History  Procedure Laterality Date  . Tonsillectomy      Family History  Problem Relation Age of Onset  . Bleeding Disorder Mother   . Stroke Father   . Heart disease Father   . Alcohol abuse Father   . Depression Maternal Grandmother   . Mental illness Maternal Grandmother     Allergies  Allergen Reactions  . Aspirin Anaphylaxis and Hives     Review of Systems  CONSTITUTIONAL: No significant weight changes, fever, chills, weakness or fatigue.  CARDIOVASCULAR: No chest pain, chest pressure or chest discomfort. No palpitations or edema.  RESPIRATORY: No shortness of breath, cough or sputum.  GASTROINTESTINAL: No anorexia,  nausea, vomiting. No changes in bowel habits. No abdominal pain or blood.  NEUROLOGICAL: No headache, dizziness, syncope, paralysis, ataxia, numbness or tingling in the extremities. No memory changes. No change in bowel or bladder control.  MUSCULOSKELETAL: Yes right shoulder blade pain. No muscle pain. HEMATOLOGIC: No anemia, bleeding or bruising.  LYMPHATICS: No enlarged lymph nodes.  PSYCHIATRIC: No change in mood. No change in sleep pattern.  ENDOCRINOLOGIC: No reports of sweating, cold or heat intolerance. No polyuria or polydipsia.     Objective  BP 84/50 mmHg  Pulse 91  Temp(Src) 97.8 F (36.6 C) (Oral)  Resp 18  Ht 5'  11" (1.803 m)  Wt 246 lb 14.4 oz (111.993 kg)  BMI 34.45 kg/m2  SpO2 97% Body mass index is 34.45 kg/(m^2).  Physical Exam  Constitutional: Patient has central obesity and well-nourished. In no distress.  Neck: Normal range of motion. Neck supple. No JVD present. No thyromegaly present.  Cardiovascular: Normal rate, regular rhythm and normal heart sounds.  No murmur heard.  Pulmonary/Chest: Effort normal and breath sounds normal. No respiratory distress. Musculoskeletal: Normal range of motion bilateral UE and LE, no joint effusions. Peripheral vascular: Bilateral LE no edema. Neurological: CN II-XII grossly intact with no focal deficits. Alert and oriented to person, place, and time. Coordination, balance, strength, speech and gait are normal.  Skin: Skin is warm and dry. No rash noted. No erythema.  Psychiatric: Patient has a normal mood and affect. Behavior is normal in office today. Judgment and thought content normal in office today.   Assessment & Plan   1. Allergic rhinitis with postnasal drip Stable. - fluticasone (FLONASE) 50 MCG/ACT nasal spray; Place 2 sprays into both nostrils daily.  Dispense: 16 g; Refill: 5  2. GERD without esophagitis Stable. - omeprazole (PRILOSEC) 40 MG capsule; Take 1 capsule (40 mg total) by mouth daily.  Dispense: 90 capsule; Refill: 3  3. Hypotension determined by examination Asymptomatic. Instructed patient to increase hydration with water and Gatorade. If symptomatic proceed to ED.

## 2015-01-20 ENCOUNTER — Encounter: Payer: Self-pay | Admitting: *Deleted

## 2015-01-21 ENCOUNTER — Encounter: Payer: Self-pay | Admitting: *Deleted

## 2015-01-21 ENCOUNTER — Encounter
Admission: RE | Disposition: A | Discharge status: Home / Self Care | Payer: Self-pay | Source: Ambulatory Visit | Attending: Unknown Physician Specialty

## 2015-01-21 ENCOUNTER — Ambulatory Visit: Payer: Commercial Managed Care - HMO | Admitting: Certified Registered Nurse Anesthetist

## 2015-01-21 ENCOUNTER — Ambulatory Visit
Admission: RE | Admit: 2015-01-21 | Discharge: 2015-01-21 | Disposition: A | Discharge status: Home / Self Care | Payer: Commercial Managed Care - HMO | Source: Ambulatory Visit | Attending: Unknown Physician Specialty | Admitting: Unknown Physician Specialty

## 2015-01-21 DIAGNOSIS — Z79899 Other long term (current) drug therapy: Secondary | ICD-10-CM | POA: Insufficient documentation

## 2015-01-21 DIAGNOSIS — D123 Benign neoplasm of transverse colon: Secondary | ICD-10-CM | POA: Diagnosis not present

## 2015-01-21 DIAGNOSIS — K640 First degree hemorrhoids: Secondary | ICD-10-CM | POA: Insufficient documentation

## 2015-01-21 DIAGNOSIS — J45909 Unspecified asthma, uncomplicated: Secondary | ICD-10-CM | POA: Insufficient documentation

## 2015-01-21 DIAGNOSIS — K5730 Diverticulosis of large intestine without perforation or abscess without bleeding: Secondary | ICD-10-CM | POA: Diagnosis not present

## 2015-01-21 DIAGNOSIS — G709 Myoneural disorder, unspecified: Secondary | ICD-10-CM | POA: Insufficient documentation

## 2015-01-21 DIAGNOSIS — Z86010 Personal history of colonic polyps: Secondary | ICD-10-CM | POA: Diagnosis present

## 2015-01-21 DIAGNOSIS — D125 Benign neoplasm of sigmoid colon: Secondary | ICD-10-CM | POA: Insufficient documentation

## 2015-01-21 DIAGNOSIS — G4730 Sleep apnea, unspecified: Secondary | ICD-10-CM | POA: Insufficient documentation

## 2015-01-21 DIAGNOSIS — Z87891 Personal history of nicotine dependence: Secondary | ICD-10-CM | POA: Diagnosis not present

## 2015-01-21 DIAGNOSIS — K219 Gastro-esophageal reflux disease without esophagitis: Secondary | ICD-10-CM | POA: Diagnosis not present

## 2015-01-21 DIAGNOSIS — D124 Benign neoplasm of descending colon: Secondary | ICD-10-CM | POA: Insufficient documentation

## 2015-01-21 DIAGNOSIS — M5431 Sciatica, right side: Secondary | ICD-10-CM | POA: Insufficient documentation

## 2015-01-21 DIAGNOSIS — M1990 Unspecified osteoarthritis, unspecified site: Secondary | ICD-10-CM | POA: Insufficient documentation

## 2015-01-21 HISTORY — PX: COLONOSCOPY WITH PROPOFOL: SHX5780

## 2015-01-21 HISTORY — DX: Fatty (change of) liver, not elsewhere classified: K76.0

## 2015-01-21 HISTORY — DX: Sleep apnea, unspecified: G47.30

## 2015-01-21 SURGERY — COLONOSCOPY WITH PROPOFOL
Anesthesia: General

## 2015-01-21 MED ORDER — SODIUM CHLORIDE 0.9 % IV SOLN: INTRAVENOUS | Status: DC

## 2015-01-21 MED ORDER — LIDOCAINE HCL (CARDIAC) 20 MG/ML IV SOLN
INTRAVENOUS | Status: DC | PRN
  Administered 2015-01-21: 60 mg via INTRAVENOUS

## 2015-01-21 MED ORDER — SODIUM CHLORIDE 0.9 % IV SOLN
INTRAVENOUS | Status: DC
  Administered 2015-01-21: 08:00:00 via INTRAVENOUS

## 2015-01-21 MED ORDER — PROPOFOL 500 MG/50ML IV EMUL
INTRAVENOUS | Status: DC | PRN
  Administered 2015-01-21: 75 ug/kg/min via INTRAVENOUS

## 2015-01-21 MED ORDER — FENTANYL CITRATE (PF) 100 MCG/2ML IJ SOLN
INTRAMUSCULAR | Status: DC | PRN
  Administered 2015-01-21: 50 ug via INTRAVENOUS

## 2015-01-21 MED ORDER — FENTANYL CITRATE (PF) 100 MCG/2ML IJ SOLN: 25.0000 ug | INTRAMUSCULAR | Status: DC | PRN

## 2015-01-21 MED ORDER — MIDAZOLAM HCL 5 MG/5ML IJ SOLN
INTRAMUSCULAR | Status: DC | PRN
  Administered 2015-01-21: 1 mg via INTRAVENOUS

## 2015-01-21 MED ORDER — ONDANSETRON HCL 4 MG/2ML IJ SOLN: 4.0000 mg | Freq: Once | INTRAMUSCULAR | Status: DC | PRN

## 2015-01-21 MED ORDER — PROPOFOL 10 MG/ML IV BOLUS
INTRAVENOUS | Status: DC | PRN
  Administered 2015-01-21: 50 mg via INTRAVENOUS

## 2015-01-21 NOTE — Op Note (Signed)
[facility] Gastroenterology Patient Name: Michael Bartlett Procedure Date: 01/21/2015 8:07 AM MRN: [id number] Account #: [id number] Date of Birth: 11-Jan-1951 Admit Type: Outpatient Age: 64 Room: [facility] ENDO ROOM 1 Gender: Male Note Status: Finalized Procedure:         Colonoscopy Indications:       High risk colon cancer surveillance: Personal history of                     colonic polyps Providers:         Manya Silvas, MD Referring MD:      Bobetta Lime (Referring MD) Medicines:         Propofol per Anesthesia Complications:     No immediate complications. Procedure:         Pre-Anesthesia Assessment:                    - After reviewing the risks and benefits, the patient was                     deemed in satisfactory condition to undergo the procedure.                    After obtaining informed consent, the colonoscope was                     passed under direct vision. Throughout the procedure, the                     patient's blood pressure, pulse, and oxygen saturations                     were monitored continuously. The Colonoscope was                     introduced through the anus and advanced to the the cecum,                     identified by appendiceal orifice and ileocecal valve. The                     colonoscopy was performed without difficulty. The patient                     tolerated the procedure well. The quality of the bowel                     preparation was good. Findings:      TThe colon was long and redundant. Could see the cecum but not the small       area behiind the IC valve.      Four sessile polyps were found in the sigmoid colon, in the descending       colon and in the transverse colon. Sigmoid had 2. The polyps were       diminutive in size. These polyps were removed with a jumbo cold forceps.       Resection and retrieval were complete.      Internal hemorrhoids were found during endoscopy. The hemorrhoids were       small and Grade I (internal hemorrhoids that do not prolapse). Impression:        - Four diminutive polyps in the sigmoid colon, in the  descending colon and in the transverse colon. Resected and                     retrieved.                    - Internal hemorrhoids. Recommendation:    - Await pathology results.                    - The findings and recommendations were discussed with the                     patient's family. Manya Silvas, MD 01/21/2015 8:56:06 AM This report has been signed electronically. Number of Addenda: 0 Note Initiated On: 01/21/2015 8:07 AM Scope Withdrawal Time: 0 hours 17 minutes 29 seconds  Total Procedure Duration: 0 hours 39 minutes 24 seconds       [facility]

## 2015-01-21 NOTE — Anesthesia Preprocedure Evaluation (Signed)
Anesthesia Evaluation  Patient identified by MRN, date of birth, ID band Patient awake    Reviewed: Allergy & Precautions, NPO status , Patient's Chart, lab work & pertinent test results  Airway Mallampati: II      Comment: overbite Dental  (+) Caps, Chipped   Pulmonary asthma , sleep apnea and Continuous Positive Airway Pressure Ventilation , former smoker,    Pulmonary exam normal breath sounds clear to auscultation       Cardiovascular negative cardio ROS Normal cardiovascular exam     Neuro/Psych  Headaches, R sciatic nerve pain  Neuromuscular disease negative psych ROS   GI/Hepatic Neg liver ROS, GERD  Medicated and Controlled,diverticulosis   Endo/Other  negative endocrine ROS  Renal/GU negative Renal ROS  negative genitourinary   Musculoskeletal  (+) Arthritis , Osteoarthritis,    Abdominal Normal abdominal exam  (+)   Peds negative pediatric ROS (+)  Hematology negative hematology ROS (+)   Anesthesia Other Findings   Reproductive/Obstetrics                             Anesthesia Physical Anesthesia Plan  ASA: II  Anesthesia Plan: General   Post-op Pain Management:    Induction: Intravenous  Airway Management Planned: Nasal Cannula  Additional Equipment:   Intra-op Plan:   Post-operative Plan:   Informed Consent: I have reviewed the patients History and Physical, chart, labs and discussed the procedure including the risks, benefits and alternatives for the proposed anesthesia with the patient or authorized representative who has indicated his/her understanding and acceptance.   Dental advisory given  Plan Discussed with: CRNA and Surgeon  Anesthesia Plan Comments:         Anesthesia Quick Evaluation

## 2015-01-21 NOTE — Transfer of Care (Signed)
Immediate Anesthesia Transfer of Care Note  Patient: Michael Bartlett  Procedure(s) Performed: Procedure(s): COLONOSCOPY WITH PROPOFOL (N/A)  Patient Location: PACU  Anesthesia Type:General  Level of Consciousness: awake, alert , oriented and patient cooperative  Airway & Oxygen Therapy: Patient Spontanous Breathing and Patient connected to nasal cannula oxygen  Post-op Assessment: Report given to RN and Post -op Vital signs reviewed and stable  Post vital signs: Reviewed and stable  Last Vitals:  Filed Vitals:   01/21/15 0858  BP: 129/85  Pulse: 90  Temp: 36 C  Resp: 17    Complications: No apparent anesthesia complications

## 2015-01-21 NOTE — H&P (Signed)
Primary Care Physician:  Bobetta Lime, MD Primary Gastroenterologist:  Dr. Vira Agar  Pre-Procedure History & Physical: HPI:  Michael Bartlett is a 64 y.o. male is here for an colonoscopy.   Past Medical History  Diagnosis Date  . Allergy   . Sciatica   . Arthritis   . Sinus headache   . Asthma   . Colon polyps   . Diverticulosis of large intestine without hemorrhage   . Sleep apnea with use of continuous positive airway pressure (CPAP)   . Fatty liver disease, nonalcoholic     Past Surgical History  Procedure Laterality Date  . Tonsillectomy      Prior to Admission medications   Medication Sig Start Date End Date Taking? Authorizing Provider  fluticasone (FLONASE) 50 MCG/ACT nasal spray Place 2 sprays into both nostrils daily. 01/18/15  Yes Bobetta Lime, MD  omeprazole (PRILOSEC) 40 MG capsule Take 1 capsule (40 mg total) by mouth daily. 01/18/15  Yes Bobetta Lime, MD  polyethylene glycol-electrolytes (NULYTELY/GOLYTELY) 420 G solution TK 4000 MLS PO ONCE UTD FOR COLONOSCOPY PREP 12/31/14  Yes Historical Provider, MD  saw palmetto (RA SAW PALMETTO) 80 MG capsule Take 1 capsule by mouth daily.   Yes Historical Provider, MD  vitamin E 400 UNIT capsule Take 1 capsule by mouth daily.   Yes Historical Provider, MD  Cholecalciferol (D3-1000) 1000 UNITS capsule Take 1 capsule by mouth daily.    Historical Provider, MD  Coenzyme Q10 (COQ10) 100 MG CAPS Take 1 capsule by mouth daily.    Historical Provider, MD  Multiple Vitamin (MULTI-VITAMINS) TABS Take 1 tablet by mouth daily.    Historical Provider, MD  Omega-3 Fatty Acids (FISH OIL) 1200 MG CPDR Take 1 capsule by mouth daily.    Historical Provider, MD  polyethylene glycol (MIRALAX / GLYCOLAX) packet Take 17 g by mouth daily as needed.    Historical Provider, MD    Allergies as of 08/11/2014  . (Not on File)    Family History  Problem Relation Age of Onset  . Bleeding Disorder Mother   . Stroke Father   . Heart  disease Father   . Alcohol abuse Father   . Depression Maternal Grandmother   . Mental illness Maternal Grandmother     Social History   Social History  . Marital Status: Married    Spouse Name: N/A  . Number of Children: N/A  . Years of Education: N/A   Occupational History  . Not on file.   Social History Main Topics  . Smoking status: Former Research scientist (life sciences)  . Smokeless tobacco: Current User     Comment: will be 77yrs w/o smoking October 2016  . Alcohol Use: No  . Drug Use: No  . Sexual Activity:    Partners: Female    Museum/gallery curator: None   Other Topics Concern  . Not on file   Social History Narrative    Review of Systems: See HPI, otherwise negative ROS  Physical Exam: BP 120/69 mmHg  Pulse 72  Temp(Src) 98.3 F (36.8 C) (Tympanic)  Resp 14  SpO2 100% General:   Alert,  pleasant and cooperative in NAD Head:  Normocephalic and atraumatic. Neck:  Supple; no masses or thyromegaly. Lungs:  Clear throughout to auscultation.    Heart:  Regular rate and rhythm. Abdomen:  Soft, nontender and nondistended. Normal bowel sounds, without guarding, and without rebound.   Neurologic:  Alert and  oriented x4;  grossly normal neurologically.  Impression/Plan: Michael Bartlett is  here for an colonoscopy to be performed for Personal history of colon polyps  Risks, benefits, limitations, and alternatives regarding  colonoscopy have been reviewed with the patient.  Questions have been answered.  All parties agreeable.   Gaylyn Cheers, MD  01/21/2015, 8:04 AM

## 2015-01-25 LAB — SURGICAL PATHOLOGY

## 2015-01-25 NOTE — Anesthesia Postprocedure Evaluation (Signed)
  Anesthesia Post-op Note  Patient: Michael Bartlett  Procedure(s) Performed: Procedure(s): COLONOSCOPY WITH PROPOFOL (N/A)  Anesthesia type:General  Patient location: PACU  Post pain: Pain level controlled  Post assessment: Post-op Vital signs reviewed, Patient's Cardiovascular Status Stable, Respiratory Function Stable, Patent Airway and No signs of Nausea or vomiting  Post vital signs: Reviewed and stable  Last Vitals:  Filed Vitals:   01/21/15 0927  BP: 106/71  Pulse: 67  Temp:   Resp: 15    Level of consciousness: awake, alert  and patient cooperative  Complications: No apparent anesthesia complications

## 2015-02-08 ENCOUNTER — Ambulatory Visit (INDEPENDENT_AMBULATORY_CARE_PROVIDER_SITE_OTHER): Payer: Commercial Managed Care - HMO | Admitting: Family Medicine

## 2015-02-08 VITALS — BP 122/86 | HR 95 | Temp 97.9°F | Resp 16 | Wt 245.6 lb

## 2015-02-08 DIAGNOSIS — J0100 Acute maxillary sinusitis, unspecified: Secondary | ICD-10-CM | POA: Diagnosis not present

## 2015-02-08 MED ORDER — AZITHROMYCIN 250 MG PO TABS: ORAL_TABLET | ORAL | Status: AC
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
## 2015-02-17 ENCOUNTER — Telehealth: Payer: Self-pay | Admitting: Family Medicine

## 2015-02-17 ENCOUNTER — Other Ambulatory Visit: Payer: Self-pay | Admitting: Family Medicine

## 2015-02-17 DIAGNOSIS — R0602 Shortness of breath: Secondary | ICD-10-CM

## 2015-02-17 MED ORDER — ALBUTEROL SULFATE HFA 108 (90 BASE) MCG/ACT IN AERS: 2.0000 | INHALATION_SPRAY | RESPIRATORY_TRACT | Status: AC | PRN

## 2015-02-17 NOTE — Telephone Encounter (Signed)
Done

## 2015-02-17 NOTE — Telephone Encounter (Signed)
Patient was seen by Dr. Nicki Reaper last week and was given antibiotics. He do not feel that he needs another round of antibiotics but is requesting a prescription for an inhaler (ProAir HFA). He chest is still tight. Please send to [facility].

## 2015-03-08 ENCOUNTER — Ambulatory Visit: Payer: 59 | Admitting: Family Medicine

## 2015-03-11 ENCOUNTER — Ambulatory Visit: Payer: 59 | Admitting: Family Medicine

## 2015-03-30 ENCOUNTER — Ambulatory Visit: Payer: Commercial Managed Care - HMO | Admitting: Family Medicine

## 2016-01-06 ENCOUNTER — Other Ambulatory Visit: Payer: Self-pay

## 2016-01-06 DIAGNOSIS — K219 Gastro-esophageal reflux disease without esophagitis: Secondary | ICD-10-CM

## 2016-01-06 MED ORDER — RANITIDINE HCL 300 MG PO CAPS: 300.0000 mg | ORAL_CAPSULE | Freq: Every evening | ORAL | 0 refills | Status: AC

## 2016-01-06 NOTE — Telephone Encounter (Signed)
Patient will need an appt soon; last visit was almost a year ago I reviewed hx; GERD without esophagitis; not on NSAIDs or blood thinners Let patient know I recommend he stop using the omeprazole Long-term use is hazardous I'll send in Rx for H2 blocker (ranitidine) Avoid triggers (coffee, soft drinks, acidic foods, tomato-based foods, BBQ, onions, chocolate, peppers, etc) We can discuss at his appt; thank you

## 2016-01-07 NOTE — Telephone Encounter (Signed)
Good Morning Dr. Sanda Klein. Spoke with the Pt and he stated he is no longer a pt with [facility] and he no longer use [facility] because he moved to [place]. Called Pharmacy canceled the RX.

## 2016-01-07 NOTE — Telephone Encounter (Signed)
Pt is no longer a Pt with [facility] and will not be using [facility] as a pharmacy. Pt no longer reside in [place], [place]. Called the Pharmacy to cancel the Rx. PX Canceled.

## 2016-01-17 ENCOUNTER — Other Ambulatory Visit: Payer: Self-pay

## 2016-01-17 DIAGNOSIS — K219 Gastro-esophageal reflux disease without esophagitis: Secondary | ICD-10-CM

## 2016-01-17 NOTE — Telephone Encounter (Signed)
See last phone note; he is no longer a pt here
